# Patient Record
Sex: Male | Born: 1970 | Race: Black or African American | Hispanic: No
Health system: Southern US, Community
[De-identification: ages and names within clinical notes are randomized; demographics above are authoritative.]

## PROBLEM LIST (undated history)

## (undated) HISTORY — PX: NO PAST SURGERIES: SHX2092

---

## 1999-01-19 ENCOUNTER — Emergency Department (HOSPITAL_COMMUNITY): Admission: EM | Admit: 1999-01-19 | Discharge: 1999-01-19 | Payer: Self-pay | Admitting: Emergency Medicine

## 2006-07-21 ENCOUNTER — Emergency Department (HOSPITAL_COMMUNITY): Admission: EM | Admit: 2006-07-21 | Discharge: 2006-07-21 | Payer: Self-pay | Admitting: Emergency Medicine

## 2006-07-26 ENCOUNTER — Emergency Department (HOSPITAL_COMMUNITY): Admission: EM | Admit: 2006-07-26 | Discharge: 2006-07-26 | Payer: Self-pay | Admitting: Emergency Medicine

## 2008-04-05 ENCOUNTER — Emergency Department (HOSPITAL_COMMUNITY): Admission: EM | Admit: 2008-04-05 | Discharge: 2008-04-05 | Payer: Self-pay | Admitting: *Deleted

## 2010-10-13 ENCOUNTER — Inpatient Hospital Stay (INDEPENDENT_AMBULATORY_CARE_PROVIDER_SITE_OTHER)
Admission: RE | Admit: 2010-10-13 | Discharge: 2010-10-13 | Disposition: A | Discharge status: Home / Self Care | Payer: Self-pay | Source: Ambulatory Visit | Attending: Family Medicine | Admitting: Family Medicine

## 2010-10-13 ENCOUNTER — Ambulatory Visit (INDEPENDENT_AMBULATORY_CARE_PROVIDER_SITE_OTHER): Payer: Self-pay

## 2010-10-13 DIAGNOSIS — S82899A Other fracture of unspecified lower leg, initial encounter for closed fracture: Secondary | ICD-10-CM

## 2013-12-17 ENCOUNTER — Emergency Department (HOSPITAL_COMMUNITY): Payer: Self-pay

## 2013-12-17 ENCOUNTER — Emergency Department (HOSPITAL_COMMUNITY)
Admission: EM | Admit: 2013-12-17 | Discharge: 2013-12-17 | Disposition: A | Discharge status: Home / Self Care | Payer: Self-pay | Attending: Emergency Medicine | Admitting: Emergency Medicine

## 2013-12-17 ENCOUNTER — Encounter (HOSPITAL_COMMUNITY): Payer: Self-pay | Admitting: Emergency Medicine

## 2013-12-17 DIAGNOSIS — S0990XA Unspecified injury of head, initial encounter: Secondary | ICD-10-CM | POA: Insufficient documentation

## 2013-12-17 DIAGNOSIS — M549 Dorsalgia, unspecified: Secondary | ICD-10-CM

## 2013-12-17 DIAGNOSIS — M545 Low back pain, unspecified: Secondary | ICD-10-CM

## 2013-12-17 DIAGNOSIS — Y92410 Unspecified street and highway as the place of occurrence of the external cause: Secondary | ICD-10-CM | POA: Insufficient documentation

## 2013-12-17 DIAGNOSIS — M5030 Other cervical disc degeneration, unspecified cervical region: Secondary | ICD-10-CM | POA: Insufficient documentation

## 2013-12-17 DIAGNOSIS — IMO0002 Reserved for concepts with insufficient information to code with codable children: Secondary | ICD-10-CM | POA: Insufficient documentation

## 2013-12-17 DIAGNOSIS — F17200 Nicotine dependence, unspecified, uncomplicated: Secondary | ICD-10-CM | POA: Insufficient documentation

## 2013-12-17 DIAGNOSIS — S0993XA Unspecified injury of face, initial encounter: Secondary | ICD-10-CM | POA: Insufficient documentation

## 2013-12-17 DIAGNOSIS — S199XXA Unspecified injury of neck, initial encounter: Principal | ICD-10-CM

## 2013-12-17 DIAGNOSIS — Y9389 Activity, other specified: Secondary | ICD-10-CM | POA: Insufficient documentation

## 2013-12-17 DIAGNOSIS — S6990XA Unspecified injury of unspecified wrist, hand and finger(s), initial encounter: Secondary | ICD-10-CM | POA: Insufficient documentation

## 2013-12-17 DIAGNOSIS — M542 Cervicalgia: Secondary | ICD-10-CM

## 2013-12-17 MED ORDER — IBUPROFEN 400 MG PO TABS
600.0000 mg | ORAL_TABLET | Freq: Once | ORAL | Status: AC
  Administered 2013-12-17: 600 mg via ORAL
  Filled 2013-12-17 (×2): qty 1

## 2013-12-17 MED ORDER — OXYCODONE-ACETAMINOPHEN 5-325 MG PO TABS
1.0000 | ORAL_TABLET | Freq: Once | ORAL | Status: AC
  Administered 2013-12-17: 1 via ORAL
  Filled 2013-12-17: qty 1

## 2013-12-17 MED ORDER — MELOXICAM 7.5 MG PO TABS: 15.0000 mg | ORAL_TABLET | Freq: Every day | ORAL | Status: DC

## 2013-12-17 MED ORDER — METHOCARBAMOL 500 MG PO TABS: 500.0000 mg | ORAL_TABLET | Freq: Two times a day (BID) | ORAL | Status: DC

## 2013-12-17 MED ORDER — DIAZEPAM 5 MG PO TABS
5.0000 mg | ORAL_TABLET | Freq: Once | ORAL | Status: AC
  Administered 2013-12-17: 5 mg via ORAL
  Filled 2013-12-17: qty 1

## 2013-12-17 MED ORDER — OXYCODONE-ACETAMINOPHEN 5-325 MG PO TABS: 1.0000 | ORAL_TABLET | Freq: Once | ORAL | Status: DC

## 2013-12-17 MED ORDER — HYDROCODONE-ACETAMINOPHEN 5-325 MG PO TABS: 2.0000 | ORAL_TABLET | Freq: Four times a day (QID) | ORAL | Status: DC | PRN

## 2013-12-17 NOTE — ED Notes (Signed)
Declined W/C at D/C and was escorted to lobby by RN. 
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
## 2013-12-21 NOTE — ED Provider Notes (Signed)
Medical screening examination/treatment/procedure(s) were performed by non-physician practitioner and as supervising physician I was immediately available for consultation/collaboration.   EKG Interpretation None       Raeford RazorStephen Azie Mcconahy, MD 12/21/13 [phone]

## 2014-01-03 ENCOUNTER — Encounter: Payer: Self-pay | Admitting: Internal Medicine

## 2014-01-03 ENCOUNTER — Ambulatory Visit: Payer: Self-pay | Attending: Internal Medicine | Admitting: Internal Medicine

## 2014-01-03 VITALS — BP 94/57 | HR 55 | Temp 97.8°F | Resp 18 | Ht 71.0 in | Wt 186.2 lb

## 2014-01-03 DIAGNOSIS — F17200 Nicotine dependence, unspecified, uncomplicated: Secondary | ICD-10-CM | POA: Insufficient documentation

## 2014-01-03 DIAGNOSIS — M545 Low back pain, unspecified: Secondary | ICD-10-CM | POA: Insufficient documentation

## 2014-01-03 DIAGNOSIS — M542 Cervicalgia: Secondary | ICD-10-CM | POA: Insufficient documentation

## 2014-01-03 MED ORDER — MELOXICAM 15 MG PO TABS: 15.0000 mg | ORAL_TABLET | Freq: Every day | ORAL | Status: DC

## 2014-01-03 MED ORDER — METHOCARBAMOL 500 MG PO TABS: 500.0000 mg | ORAL_TABLET | Freq: Two times a day (BID) | ORAL | Status: DC | PRN

## 2014-01-03 MED ORDER — TRAMADOL HCL 50 MG PO TABS: 50.0000 mg | ORAL_TABLET | Freq: Three times a day (TID) | ORAL | Status: DC | PRN

## 2014-01-03 NOTE — Patient Instructions (Signed)
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
## 2014-09-14 ENCOUNTER — Emergency Department (HOSPITAL_COMMUNITY)
Admission: EM | Admit: 2014-09-14 | Discharge: 2014-09-14 | Disposition: A | Discharge status: Home / Self Care | Payer: Self-pay | Attending: Emergency Medicine | Admitting: Emergency Medicine

## 2014-09-14 ENCOUNTER — Encounter (HOSPITAL_COMMUNITY): Payer: Self-pay | Admitting: Nurse Practitioner

## 2014-09-14 ENCOUNTER — Emergency Department (HOSPITAL_COMMUNITY): Payer: Self-pay

## 2014-09-14 DIAGNOSIS — Y92008 Other place in unspecified non-institutional (private) residence as the place of occurrence of the external cause: Secondary | ICD-10-CM | POA: Insufficient documentation

## 2014-09-14 DIAGNOSIS — S161XXA Strain of muscle, fascia and tendon at neck level, initial encounter: Secondary | ICD-10-CM | POA: Insufficient documentation

## 2014-09-14 DIAGNOSIS — Y999 Unspecified external cause status: Secondary | ICD-10-CM | POA: Insufficient documentation

## 2014-09-14 DIAGNOSIS — Z720 Tobacco use: Secondary | ICD-10-CM | POA: Insufficient documentation

## 2014-09-14 DIAGNOSIS — W109XXA Fall (on) (from) unspecified stairs and steps, initial encounter: Secondary | ICD-10-CM | POA: Insufficient documentation

## 2014-09-14 DIAGNOSIS — S4992XA Unspecified injury of left shoulder and upper arm, initial encounter: Secondary | ICD-10-CM | POA: Insufficient documentation

## 2014-09-14 DIAGNOSIS — S39012A Strain of muscle, fascia and tendon of lower back, initial encounter: Secondary | ICD-10-CM | POA: Insufficient documentation

## 2014-09-14 DIAGNOSIS — Y939 Activity, unspecified: Secondary | ICD-10-CM | POA: Insufficient documentation

## 2014-09-14 MED ORDER — HYDROCODONE-ACETAMINOPHEN 5-325 MG PO TABS: 1.0000 | ORAL_TABLET | Freq: Four times a day (QID) | ORAL | Status: DC | PRN

## 2014-09-14 MED ORDER — CYCLOBENZAPRINE HCL 10 MG PO TABS: 10.0000 mg | ORAL_TABLET | Freq: Every day | ORAL | Status: DC

## 2014-09-14 MED ORDER — PREDNISONE 50 MG PO TABS: 50.0000 mg | ORAL_TABLET | Freq: Every day | ORAL | Status: DC

## 2014-09-14 MED ORDER — KETOROLAC TROMETHAMINE 60 MG/2ML IM SOLN
60.0000 mg | Freq: Once | INTRAMUSCULAR | Status: AC
Start: 2014-09-14 — End: 2014-09-14
  Administered 2014-09-14: 60 mg via INTRAMUSCULAR
  Filled 2014-09-14: qty 2

## 2014-09-14 MED ORDER — OXYCODONE-ACETAMINOPHEN 5-325 MG PO TABS
1.0000 | ORAL_TABLET | Freq: Once | ORAL | Status: AC
  Administered 2014-09-14: 1 via ORAL
  Filled 2014-09-14: qty 1

## 2014-09-14 MED ORDER — DEXAMETHASONE SODIUM PHOSPHATE 10 MG/ML IJ SOLN
10.0000 mg | Freq: Once | INTRAMUSCULAR | Status: AC
  Administered 2014-09-14: 10 mg via INTRAMUSCULAR
  Filled 2014-09-14: qty 1

## 2014-09-14 NOTE — Discharge Instructions (Signed)
Return here as needed. Follow up with your doctor. °

## 2014-09-14 NOTE — ED Notes (Signed)
He sipped and fell down about 6 stairs this am. He c/o L arm, L side of neck, buttocks, lower back pain since. He is ambulatory, denies head pain, loc.

## 2014-09-14 NOTE — ED Provider Notes (Signed)
CSN: [id number]     Arrival date & time 09/14/14  1253 History  This chart was scribed for non-physician practitioner Ebbie Ridgehris Shaye Elling, PA, working with Glynn OctaveStephen Rancour, MD, by Tanda RockersMargaux Venter, ED Scribe. This patient was seen in room TR06C/TR06C and the patient's care was started at 1:53 PM.     Chief Complaint  Patient presents with  . Back Pain   The history is provided by the patient. No language interpreter was used.     HPI Comments: Zachary Nixon is a 44 y.o. male who presents to the Emergency Department complaining of sudden onset, lower back pain that occurred this morning. Pt states that he slipped down approximately 6 stairs on his porch today, causing the pain. He claims that the steps were wet which caused him to slip and fall. He reached for the bannister with his left arm and landed on his buttocks. He complains of left shoulder pain and left neck pain as well. Pt denies LOC, urinary or bowel incontinence, saddle anesthesia, or any other symptoms.     History reviewed. No pertinent past medical history. History reviewed. No pertinent past surgical history. Family History  Problem Relation Age of Onset  . Hypertension Mother   . Cancer Father    History  Substance Use Topics  . Smoking status: Current Every Day Smoker -- 0.50 packs/day    Types: Cigarettes  . Smokeless tobacco: Not on file  . Alcohol Use: No    Review of Systems  A complete 10 system review of systems was obtained and all systems are negative except as noted in the HPI and PMH.    Allergies  Review of patient's allergies indicates no known allergies.  Home Medications   Prior to Admission medications   Medication Sig Start Date End Date Taking? Authorizing Provider  HYDROcodone-acetaminophen (NORCO/VICODIN) 5-325 MG per tablet Take 2 tablets by mouth every 6 (six) hours as needed for moderate pain or severe pain. 12/17/13   Junius FinnerErin O'Malley, PA-C  ibuprofen (ADVIL,MOTRIN) 200 MG tablet Take 400 mg  by mouth every 6 (six) hours as needed for moderate pain.    Historical Provider, MD  meloxicam (MOBIC) 15 MG tablet Take 1 tablet (15 mg total) by mouth daily. 01/03/14   Ambrose FinlandValerie A Keck, NP  meloxicam (MOBIC) 7.5 MG tablet Take 2 tablets (15 mg total) by mouth daily. Take daily for 7 days, then daily as needed for pain. 12/17/13   Junius FinnerErin O'Malley, PA-C  methocarbamol (ROBAXIN) 500 MG tablet Take 1 tablet (500 mg total) by mouth 2 (two) times daily as needed for muscle spasms. 01/03/14   Ambrose FinlandValerie A Keck, NP  traMADol (ULTRAM) 50 MG tablet Take 1 tablet (50 mg total) by mouth every 8 (eight) hours as needed. 01/03/14   Ambrose FinlandValerie A Keck, NP   Triage Vitals: BP 129/66 mmHg  Pulse 84  Temp(Src) 98.2 F (36.8 C) (Oral)  Resp 20  SpO2 99%   Physical Exam  Constitutional: He is oriented to person, place, and time. He appears well-developed and well-nourished. No distress.  HENT:  Head: Normocephalic and atraumatic.  Mouth/Throat: Oropharynx is clear and moist.  Eyes: Pupils are equal, round, and reactive to light.  Neck: Normal range of motion. Neck supple. No tracheal deviation present.  Cardiovascular: Normal rate, regular rhythm and normal heart sounds.  Exam reveals no gallop and no friction rub.   No murmur heard. Pulmonary/Chest: Effort normal and breath sounds normal. No respiratory distress.  Musculoskeletal: Normal range of motion.  Diffuse lower back tenderness.  Left neck and left trapezius tenderness.   Neurological: He is alert and oriented to person, place, and time. He has normal reflexes. He exhibits normal muscle tone. Coordination normal.  Skin: Skin is warm and dry.  Psychiatric: He has a normal mood and affect. His behavior is normal.  Nursing note and vitals reviewed.   ED Course  Procedures (including critical care time)  DIAGNOSTIC STUDIES: Oxygen Saturation is 99% on RA, normal by my interpretation.    COORDINATION OF CARE: 1:55 PM-Discussed treatment plan which includes  pain medication, DG C Spine, DG L Spine with pt at bedside and pt agreed to plan.    Imaging Review Dg Cervical Spine Complete  09/14/2014   CLINICAL DATA:  Fall today. cervicalgia/neck pain. LEFT-sided cervical pain radiating into the shoulder.  EXAM: CERVICAL SPINE  4+ VIEWS  COMPARISON:  CT cervical spine 12/17/2013.  FINDINGS: Unchanged cervical spinal alignment. Between 1 mm and 2 mm retrolisthesis of C5 on C6 associated with degenerative disc disease. Collapse of the C5-C6 disc. Posterior disc osteophyte complex is present. Bilateral C5-C6 foraminal stenosis due to uncovertebral spurring. C6-C7 LEFT foraminal stenosis secondary to uncovertebral spurring. There is no cervical spine fracture. Mild levoconvex curve of the cervicothoracic spine. The odontoid is intact. Craniocervical junction is visible and appears normal. Prevertebral soft tissues are normal.  IMPRESSION: No acute abnormality. Moderate cervical spine degenerative disease that is unchanged compared to CT 12/17/2013.   Electronically Signed   By: Andreas NewportGeoffrey  Lamke M.D.   On: 09/14/2014 15:29   Dg Lumbar Spine Complete  09/14/2014   CLINICAL DATA:  Fall.  Pain .  Initial evaluation .  EXAM: LUMBAR SPINE - COMPLETE 4+ VIEW  COMPARISON:  None.  FINDINGS: Diffuse mild multilevel degenerative change. No acute abnormality identified. Minimal retrolisthesis of L5 on S1 of approximately 4 mm noted. This is most likely degenerative.  IMPRESSION: No acute abnormality.  Diffuse mild degenerative change.   Electronically Signed   By: Maisie Fushomas  Register   On: 09/14/2014 15:24    I personally performed the services described in this documentation, which was scribed in my presence. The recorded information has been reviewed and is accurate. '  Charlestine NightChristopher Ladiamond Gallina, PA-C 09/14/14 1600  Glynn OctaveStephen Rancour, MD 09/14/14 [phone]

## 2015-03-14 ENCOUNTER — Emergency Department (HOSPITAL_COMMUNITY)
Admission: EM | Admit: 2015-03-14 | Discharge: 2015-03-14 | Disposition: A | Discharge status: Home / Self Care | Payer: Self-pay | Attending: Emergency Medicine | Admitting: Emergency Medicine

## 2015-03-14 ENCOUNTER — Emergency Department (HOSPITAL_COMMUNITY): Payer: Self-pay

## 2015-03-14 ENCOUNTER — Encounter (HOSPITAL_COMMUNITY): Payer: Self-pay | Admitting: *Deleted

## 2015-03-14 DIAGNOSIS — R51 Headache: Secondary | ICD-10-CM

## 2015-03-14 DIAGNOSIS — Z23 Encounter for immunization: Secondary | ICD-10-CM | POA: Insufficient documentation

## 2015-03-14 DIAGNOSIS — Z7952 Long term (current) use of systemic steroids: Secondary | ICD-10-CM | POA: Insufficient documentation

## 2015-03-14 DIAGNOSIS — Z791 Long term (current) use of non-steroidal anti-inflammatories (NSAID): Secondary | ICD-10-CM | POA: Insufficient documentation

## 2015-03-14 DIAGNOSIS — S0181XA Laceration without foreign body of other part of head, initial encounter: Secondary | ICD-10-CM | POA: Insufficient documentation

## 2015-03-14 DIAGNOSIS — S060X0A Concussion without loss of consciousness, initial encounter: Secondary | ICD-10-CM | POA: Insufficient documentation

## 2015-03-14 DIAGNOSIS — Y9301 Activity, walking, marching and hiking: Secondary | ICD-10-CM | POA: Insufficient documentation

## 2015-03-14 DIAGNOSIS — S0990XA Unspecified injury of head, initial encounter: Secondary | ICD-10-CM

## 2015-03-14 DIAGNOSIS — Y999 Unspecified external cause status: Secondary | ICD-10-CM | POA: Insufficient documentation

## 2015-03-14 DIAGNOSIS — Y9289 Other specified places as the place of occurrence of the external cause: Secondary | ICD-10-CM | POA: Insufficient documentation

## 2015-03-14 DIAGNOSIS — Z79899 Other long term (current) drug therapy: Secondary | ICD-10-CM | POA: Insufficient documentation

## 2015-03-14 DIAGNOSIS — R519 Headache, unspecified: Secondary | ICD-10-CM

## 2015-03-14 DIAGNOSIS — S0191XA Laceration without foreign body of unspecified part of head, initial encounter: Secondary | ICD-10-CM

## 2015-03-14 DIAGNOSIS — Z720 Tobacco use: Secondary | ICD-10-CM | POA: Insufficient documentation

## 2015-03-14 LAB — CBG MONITORING, ED: Glucose-Capillary: 65 mg/dL (ref 65–99)

## 2015-03-14 MED ORDER — PROCHLORPERAZINE EDISYLATE 5 MG/ML IJ SOLN
5.0000 mg | Freq: Four times a day (QID) | INTRAMUSCULAR | Status: DC | PRN
  Administered 2015-03-14: 5 mg via INTRAVENOUS
  Filled 2015-03-14: qty 2

## 2015-03-14 MED ORDER — KETOROLAC TROMETHAMINE 30 MG/ML IJ SOLN
30.0000 mg | Freq: Once | INTRAMUSCULAR | Status: AC
  Administered 2015-03-14: 30 mg via INTRAVENOUS
  Filled 2015-03-14: qty 1

## 2015-03-14 MED ORDER — DIPHENHYDRAMINE HCL 50 MG/ML IJ SOLN
25.0000 mg | Freq: Once | INTRAMUSCULAR | Status: DC
  Filled 2015-03-14: qty 1

## 2015-03-14 MED ORDER — TETANUS-DIPHTH-ACELL PERTUSSIS 5-2.5-18.5 LF-MCG/0.5 IM SUSP
0.5000 mL | Freq: Once | INTRAMUSCULAR | Status: AC
  Administered 2015-03-14: 0.5 mL via INTRAMUSCULAR
  Filled 2015-03-14: qty 0.5

## 2015-03-14 MED ORDER — SODIUM CHLORIDE 0.9 % IV BOLUS (SEPSIS)
500.0000 mL | Freq: Once | INTRAVENOUS | Status: AC
  Administered 2015-03-14: 500 mL via INTRAVENOUS

## 2015-03-14 MED ORDER — DIPHENHYDRAMINE HCL 50 MG/ML IJ SOLN: 25.0000 mg | Freq: Once | INTRAMUSCULAR | Status: DC

## 2015-03-14 MED ORDER — PROCHLORPERAZINE EDISYLATE 5 MG/ML IJ SOLN: 5.0000 mg | Freq: Once | INTRAMUSCULAR | Status: DC

## 2015-03-14 MED ORDER — DEXAMETHASONE SODIUM PHOSPHATE 10 MG/ML IJ SOLN
10.0000 mg | Freq: Once | INTRAMUSCULAR | Status: AC
  Administered 2015-03-14: 10 mg via INTRAVENOUS
  Filled 2015-03-14: qty 1

## 2015-03-14 MED ORDER — BUTALBITAL-APAP-CAFFEINE 50-325-40 MG PO TABS: 1.0000 | ORAL_TABLET | Freq: Four times a day (QID) | ORAL | Status: AC | PRN
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
## 2015-03-14 NOTE — Discharge Instructions (Signed)
Concussion, Adult A concussion, or closed-head injury, is a brain injury caused by a direct blow to the head or by a quick and sudden movement (jolt) of the head or neck. Concussions are usually not life-threatening. Even so, the effects of a concussion can be serious. If you have had a concussion before, you are more likely to experience concussion-like symptoms after a direct blow to the head.  CAUSES  Direct blow to the head, such as from running into another player during a soccer game, being hit in a fight, or hitting your head on a hard surface.  A jolt of the head or neck that causes the brain to move back and forth inside the skull, such as in a car crash. SIGNS AND SYMPTOMS The signs of a concussion can be hard to notice. Early on, they may be missed by you, family members, and health care providers. You may look fine but act or feel differently. Symptoms are usually temporary, but they may last for days, weeks, or even longer. Some symptoms may appear right away while others may not show up for hours or days. Every head injury is different. Symptoms include:  Mild to moderate headaches that will not go away.  A feeling of pressure inside your head.  Having more trouble than usual:  Learning or remembering things you have heard.  Answering questions.  Paying attention or concentrating.  Organizing daily tasks.  Making decisions and solving problems.  Slowness in thinking, acting or reacting, speaking, or reading.  Getting lost or being easily confused.  Feeling tired all the time or lacking energy (fatigued).  Feeling drowsy.  Sleep disturbances.  Sleeping more than usual.  Sleeping less than usual.  Trouble falling asleep.  Trouble sleeping (insomnia).  Loss of balance or feeling lightheaded or dizzy.  Nausea or vomiting.  Numbness or tingling.  Increased sensitivity to:  Sounds.  Lights.  Distractions.  Vision problems or eyes that tire  easily.  Diminished sense of taste or smell.  Ringing in the ears.  Mood changes such as feeling sad or anxious.  Becoming easily irritated or angry for little or no reason.  Lack of motivation.  Seeing or hearing things other people do not see or hear (hallucinations). DIAGNOSIS Your health care provider can usually diagnose a concussion based on a description of your injury and symptoms. He or she will ask whether you passed out (lost consciousness) and whether you are having trouble remembering events that happened right before and during your injury. Your evaluation might include:  A brain scan to look for signs of injury to the brain. Even if the test shows no injury, you may still have a concussion.  Blood tests to be sure other problems are not present. TREATMENT  Concussions are usually treated in an emergency department, in urgent care, or at a clinic. You may need to stay in the hospital overnight for further treatment.  Tell your health care provider if you are taking any medicines, including prescription medicines, over-the-counter medicines, and natural remedies. Some medicines, such as blood thinners (anticoagulants) and aspirin, may increase the chance of complications. Also tell your health care provider whether you have had alcohol or are taking illegal drugs. This information may affect treatment.  Your health care provider will send you home with important instructions to follow.  How fast you will recover from a concussion depends on many factors. These factors include how severe your concussion is, what part of your brain was injured,  your age, and how healthy you were before the concussion.  Most people with mild injuries recover fully. Recovery can take time. In general, recovery is slower in older persons. Also, persons who have had a concussion in the past or have other medical problems may find that it takes longer to recover from their current injury. HOME  CARE INSTRUCTIONS General Instructions  Carefully follow the directions your health care provider gave you.  Only take over-the-counter or prescription medicines for pain, discomfort, or fever as directed by your health care provider.  Take only those medicines that your health care provider has approved.  Do not drink alcohol until your health care provider says you are well enough to do so. Alcohol and certain other drugs may slow your recovery and can put you at risk of further injury.  If it is harder than usual to remember things, write them down.  If you are easily distracted, try to do one thing at a time. For example, do not try to watch TV while fixing dinner.  Talk with family members or close friends when making important decisions.  Keep all follow-up appointments. Repeated evaluation of your symptoms is recommended for your recovery.  Watch your symptoms and tell others to do the same. Complications sometimes occur after a concussion. Older adults with a brain injury may have a higher risk of serious complications, such as a blood clot on the brain.  Tell your teachers, school nurse, school counselor, coach, athletic trainer, or work Freight forwarder about your injury, symptoms, and restrictions. Tell them about what you can or cannot do. They should watch for:  Increased problems with attention or concentration.  Increased difficulty remembering or learning new information.  Increased time needed to complete tasks or assignments.  Increased irritability or decreased ability to cope with stress.  Increased symptoms.  Rest. Rest helps the brain to heal. Make sure you:  Get plenty of sleep at night. Avoid staying up late at night.  Keep the same bedtime hours on weekends and weekdays.  Rest during the day. Take daytime naps or rest breaks when you feel tired.  Limit activities that require a lot of thought or concentration. These include:  Doing homework or job-related  work.  Watching TV.  Working on the computer.  Avoid any situation where there is potential for another head injury (football, hockey, soccer, basketball, martial arts, downhill snow sports and horseback riding). Your condition will get worse every time you experience a concussion. You should avoid these activities until you are evaluated by the appropriate follow-up health care providers. Returning To Your Regular Activities You will need to return to your normal activities slowly, not all at once. You must give your body and brain enough time for recovery.  Do not return to sports or other athletic activities until your health care provider tells you it is safe to do so.  Ask your health care provider when you can drive, ride a bicycle, or operate heavy machinery. Your ability to react may be slower after a brain injury. Never do these activities if you are dizzy.  Ask your health care provider about when you can return to work or school. Preventing Another Concussion It is very important to avoid another brain injury, especially before you have recovered. In rare cases, another injury can lead to permanent brain damage, brain swelling, or death. The risk of this is greatest during the first 7-10 days after a head injury. Avoid injuries by:  Wearing a  seat belt when riding in a car.  Drinking alcohol only in moderation.  Wearing a helmet when biking, skiing, skateboarding, skating, or doing similar activities.  Avoiding activities that could lead to a second concussion, such as contact or recreational sports, until your health care provider says it is okay.  Taking safety measures in your home.  Remove clutter and tripping hazards from floors and stairways.  Use grab bars in bathrooms and handrails by stairs.  Place non-slip mats on floors and in bathtubs.  Improve lighting in dim areas. SEEK MEDICAL CARE IF:  You have increased problems paying attention or  concentrating.  You have increased difficulty remembering or learning new information.  You need more time to complete tasks or assignments than before.  You have increased irritability or decreased ability to cope with stress.  You have more symptoms than before. Seek medical care if you have any of the following symptoms for more than 2 weeks after your injury:  Lasting (chronic) headaches.  Dizziness or balance problems.  Nausea.  Vision problems.  Increased sensitivity to noise or light.  Depression or mood swings.  Anxiety or irritability.  Memory problems.  Difficulty concentrating or paying attention.  Sleep problems.  Feeling tired all the time. SEEK IMMEDIATE MEDICAL CARE IF:  You have severe or worsening headaches. These may be a sign of a blood clot in the brain.  You have weakness (even if only in one hand, leg, or part of the face).  You have numbness.  You have decreased coordination.  You vomit repeatedly.  You have increased sleepiness.  One pupil is larger than the other.  You have convulsions.  You have slurred speech.  You have increased confusion. This may be a sign of a blood clot in the brain.  You have increased restlessness, agitation, or irritability.  You are unable to recognize people or places.  You have neck pain.  It is difficult to wake you up.  You have unusual behavior changes.  You lose consciousness. MAKE SURE YOU:  Understand these instructions.  Will watch your condition.  Will get help right away if you are not doing well or get worse.   This information is not intended to replace advice given to you by your health care provider. Make sure you discuss any questions you have with your health care provider.   Document Released: 07/18/2003 Document Revised: 05/18/2014 Document Reviewed: 11/17/2012 Elsevier Interactive Patient Education 2016 [employer]  General Headache Without Cause A headache is  pain or discomfort felt around the head or neck area. The specific cause of a headache may not be found. There are many causes and types of headaches. A few common ones are:  Tension headaches.  Migraine headaches.  Cluster headaches.  Chronic daily headaches. HOME CARE INSTRUCTIONS  Watch your condition for any changes. Take these steps to help with your condition: Managing Pain  Take over-the-counter and prescription medicines only as told by your health care provider.  Lie down in a dark, quiet room when you have a headache.  If directed, apply ice to the head and neck area:  Put ice in a plastic bag.  Place a towel between your skin and the bag.  Leave the ice on for 20 minutes, 2-3 times per day.  Use a heating pad or hot shower to apply heat to the head and neck area as told by your health care provider.  Keep lights dim if bright lights bother you or make  your headaches worse. Eating and Drinking  Eat meals on a regular schedule.  Limit alcohol use.  Decrease the amount of caffeine you drink, or stop drinking caffeine. General Instructions  Keep all follow-up visits as told by your health care provider. This is important.  Keep a headache journal to help find out what may trigger your headaches. For example, write down:  What you eat and drink.  How much sleep you get.  Any change to your diet or medicines.  Try massage or other relaxation techniques.  Limit stress.  Sit up straight, and do not tense your muscles.  Do not use tobacco products, including cigarettes, chewing tobacco, or e-cigarettes. If you need help quitting, ask your health care provider.  Exercise regularly as told by your health care provider.  Sleep on a regular schedule. Get 7-9 hours of sleep, or the amount recommended by your health care provider. SEEK MEDICAL CARE IF:   Your symptoms are not helped by medicine.  You have a headache that is different from the usual  headache.  You have nausea or you vomit.  You have a fever. SEEK IMMEDIATE MEDICAL CARE IF:   Your headache becomes severe.  You have repeated vomiting.  You have a stiff neck.  You have a loss of vision.  You have problems with speech.  You have pain in the eye or ear.  You have muscular weakness or loss of muscle control.  You lose your balance or have trouble walking.  You feel faint or pass out.  You have confusion.   This information is not intended to replace advice given to you by your health care provider. Make sure you discuss any questions you have with your health care provider.   Document Released: 04/27/2005 Document Revised: 01/16/2015 Document Reviewed: 08/20/2014 Elsevier Interactive Patient Education 2016 [employer]  Post-Concussion Syndrome Post-concussion syndrome describes the symptoms that can occur after a head injury. These symptoms can last from weeks to months. CAUSES  It is not clear why some head injuries cause post-concussion syndrome. It can occur whether your head injury was mild or severe and whether you were wearing head protection or not.  SIGNS AND SYMPTOMS  Memory difficulties.  Dizziness.  Headaches.  Double vision or blurry vision.  Sensitivity to light.  Hearing difficulties.  Depression.  Tiredness.  Weakness.  Difficulty with concentration.  Difficulty sleeping or staying asleep.  Vomiting.  Poor balance or instability on your feet.  Slow reaction time.  Difficulty learning and remembering things you have heard. DIAGNOSIS  There is no test to determine whether you have post-concussion syndrome. Your health care provider may order an imaging scan of your brain, such as a CT scan, to check for other problems that may be causing your symptoms (such as a severe injury inside your skull). TREATMENT  Usually, these problems disappear over time without medical care. Your health care provider may prescribe  medicine to help ease your symptoms. It is important to follow up with a neurologist to evaluate your recovery and address any lingering symptoms or issues. HOME CARE INSTRUCTIONS   Take medicines only as directed by your health care provider. Do not take aspirin. Aspirin can slow blood clotting.  Sleep with your head slightly elevated to help with headaches.  Avoid any situation where there is potential for another head injury. This includes football, hockey, soccer, basketball, martial arts, downhill snow sports, and horseback riding. Your condition will get worse every time you experience a  concussion. You should avoid these activities until you are evaluated by the appropriate follow-up health care providers.  Keep all follow-up visits as directed by your health care provider. This is important. SEEK MEDICAL CARE IF:  You have increased problems paying attention or concentrating.  You have increased difficulty remembering or learning new information.  You need more time to complete tasks or assignments than before.  You have increased irritability or decreased ability to cope with stress.  You have more symptoms than before. Seek medical care if you have any of the following symptoms for more than two weeks after your injury:  Lasting (chronic) headaches.  Dizziness or balance problems.  Nausea.  Vision problems.  Increased sensitivity to noise or light.  Depression or mood swings.  Anxiety or irritability.  Memory problems.  Difficulty concentrating or paying attention.  Sleep problems.  Feeling tired all the time. SEEK IMMEDIATE MEDICAL CARE IF:  You have confusion or unusual drowsiness.  Others find it difficult to wake you up.  You have nausea or persistent, forceful vomiting.  You feel like you are moving when you are not (vertigo). Your eyes may move rapidly back and forth.  You have convulsions or faint.  You have severe, persistent headaches that  are not relieved by medicine.  You cannot use your arms or legs normally.  One of your pupils is larger than the other.  You have clear or bloody discharge from your nose or ears.  Your problems are getting worse, not better. MAKE SURE YOU:  Understand these instructions.  Will watch your condition.  Will get help right away if you are not doing well or get worse.   This information is not intended to replace advice given to you by your health care provider. Make sure you discuss any questions you have with your health care provider.   Document Released: 10/17/2001 Document Revised: 05/18/2014 Document Reviewed: 08/02/2013 Elsevier Interactive Patient Education 2016 [employer]  Wound Care Taking care of your wound properly can help to prevent pain and infection. It can also help your wound to heal more quickly.  HOW TO CARE FOR YOUR WOUND  Take or apply over-the-counter and prescription medicines only as told by your health care provider.  If you were prescribed antibiotic medicine, take or apply it as told by your health care provider. Do not stop using the antibiotic even if your condition improves.  Clean the wound each day or as told by your health care provider.  Wash the wound with mild soap and water.  Rinse the wound with water to remove all soap.  Pat the wound dry with a clean towel. Do not rub it.  There are many different ways to close and cover a wound. For example, a wound can be covered with stitches (sutures), skin glue, or adhesive strips. Follow instructions from your health care provider about:  How to take care of your wound.  When and how you should change your bandage (dressing).  When you should remove your dressing.  Removing whatever was used to close your wound.  Check your wound every day for signs of infection. Watch for:  Redness, swelling, or pain.  Fluid, blood, or pus.  Keep the dressing dry until your health care provider  says it can be removed. Do not take baths, swim, use a hot tub, or do anything that would put your wound underwater until your health care provider approves.  Raise (elevate) the injured area above the level  of your heart while you are sitting or lying down.  Do not scratch or pick at the wound.  Keep all follow-up visits as told by your health care provider. This is important. SEEK MEDICAL CARE IF:  You received a tetanus shot and you have swelling, severe pain, redness, or bleeding at the injection site.  You have a fever.  Your pain is not controlled with medicine.  You have increased redness, swelling, or pain at the site of your wound.  You have fluid, blood, or pus coming from your wound.  You notice a bad smell coming from your wound or your dressing. SEEK IMMEDIATE MEDICAL CARE IF:  You have a red streak going away from your wound.   This information is not intended to replace advice given to you by your health care provider. Make sure you discuss any questions you have with your health care provider.   Document Released: 02/04/2008 Document Revised: 09/11/2014 Document Reviewed: 04/23/2014 Elsevier Interactive Patient Education 2016 [employer]  Head Injury, Adult You have received a head injury. It does not appear serious at this time. Headaches and vomiting are common following head injury. It should be easy to awaken from sleeping. Sometimes it is necessary for you to stay in the emergency department for a while for observation. Sometimes admission to the hospital may be needed. After injuries such as yours, most problems occur within the first 24 hours, but side effects may occur up to 7-10 days after the injury. It is important for you to carefully monitor your condition and contact your health care provider or seek immediate medical care if there is a change in your condition. WHAT ARE THE TYPES OF HEAD INJURIES? Head injuries can be as minor as a bump. Some head  injuries can be more severe. More severe head injuries include:  A jarring injury to the brain (concussion).  A bruise of the brain (contusion). This mean there is bleeding in the brain that can cause swelling.  A cracked skull (skull fracture).  Bleeding in the brain that collects, clots, and forms a bump (hematoma). WHAT CAUSES A HEAD INJURY? A serious head injury is most likely to happen to someone who is in a car wreck and is not wearing a seat belt. Other causes of major head injuries include bicycle or motorcycle accidents, sports injuries, and falls. HOW ARE HEAD INJURIES DIAGNOSED? A complete history of the event leading to the injury and your current symptoms will be helpful in diagnosing head injuries. Many times, pictures of the brain, such as CT or MRI are needed to see the extent of the injury. Often, an overnight hospital stay is necessary for observation.  WHEN SHOULD I SEEK IMMEDIATE MEDICAL CARE?  You should get help right away if:  You have confusion or drowsiness.  You feel sick to your stomach (nauseous) or have continued, forceful vomiting.  You have dizziness or unsteadiness that is getting worse.  You have severe, continued headaches not relieved by medicine. Only take over-the-counter or prescription medicines for pain, fever, or discomfort as directed by your health care provider.  You do not have normal function of the arms or legs or are unable to walk.  You notice changes in the black spots in the center of the colored part of your eye (pupil).  You have a clear or bloody fluid coming from your nose or ears.  You have a loss of vision. During the next 24 hours after the injury, you must  stay with someone who can watch you for the warning signs. This person should contact local emergency services (911 in the [place] if you have seizures, you become unconscious, or you are unable to wake up. HOW CAN I PREVENT A HEAD INJURY IN THE FUTURE? The most important  factor for preventing major head injuries is avoiding motor vehicle accidents. To minimize the potential for damage to your head, it is crucial to wear seat belts while riding in motor vehicles. Wearing helmets while bike riding and playing collision sports (like football) is also helpful. Also, avoiding dangerous activities around the house will further help reduce your risk of head injury.  WHEN CAN I RETURN TO NORMAL ACTIVITIES AND ATHLETICS? You should be reevaluated by your health care provider before returning to these activities. If you have any of the following symptoms, you should not return to activities or contact sports until 1 week after the symptoms have stopped:  Persistent headache.  Dizziness or vertigo.  Poor attention and concentration.  Confusion.  Memory problems.  Nausea or vomiting.  Fatigue or tire easily.  Irritability.  Intolerant of bright lights or loud noises.  Anxiety or depression.  Disturbed sleep. MAKE SURE YOU:   Understand these instructions.  Will watch your condition.  Will get help right away if you are not doing well or get worse.   This information is not intended to replace advice given to you by your health care provider. Make sure you discuss any questions you have with your health care provider.   Document Released: 04/27/2005 Document Revised: 05/18/2014 Document Reviewed: 01/02/2013 Elsevier Interactive Patient Education [employer]

## 2015-03-14 NOTE — ED Notes (Signed)
Patient called x3 for triage
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
## 2016-06-05 ENCOUNTER — Encounter (HOSPITAL_COMMUNITY): Payer: Self-pay | Admitting: Nurse Practitioner

## 2016-06-05 ENCOUNTER — Emergency Department (HOSPITAL_COMMUNITY)
Admission: EM | Admit: 2016-06-05 | Discharge: 2016-06-05 | Disposition: A | Discharge status: Home / Self Care | Payer: Self-pay | Attending: Emergency Medicine | Admitting: Emergency Medicine

## 2016-06-05 DIAGNOSIS — F17210 Nicotine dependence, cigarettes, uncomplicated: Secondary | ICD-10-CM | POA: Insufficient documentation

## 2016-06-05 DIAGNOSIS — K0889 Other specified disorders of teeth and supporting structures: Secondary | ICD-10-CM | POA: Insufficient documentation

## 2016-06-05 MED ORDER — OXYCODONE-ACETAMINOPHEN 5-325 MG PO TABS
2.0000 | ORAL_TABLET | Freq: Four times a day (QID) | ORAL | 0 refills | Status: AC | PRN
Start: 2016-06-05 — End: 2016-06-08

## 2016-06-05 MED ORDER — PENICILLIN V POTASSIUM 500 MG PO TABS: 500.0000 mg | ORAL_TABLET | Freq: Three times a day (TID) | ORAL | 0 refills | Status: AC

## 2016-06-05 MED ORDER — HYDROCODONE-ACETAMINOPHEN 5-325 MG PO TABS
2.0000 | ORAL_TABLET | Freq: Once | ORAL | Status: AC
  Administered 2016-06-05: 2 via ORAL
  Filled 2016-06-05: qty 2

## 2016-06-05 NOTE — ED Provider Notes (Signed)
[facility]-EMERGENCY DEPT Provider Note   CSN: [id number] Arrival date & time: 06/05/16  1147 By signing my name below, I, Bridgette Habermann, attest that this documentation has been prepared under the direction and in the presence of Sharen Heck, PA-C. Electronically Signed: Bridgette Habermann, ED Scribe. 06/05/16. 2:59 PM.  History   Chief Complaint Chief Complaint  Patient presents with  . Dental Pain    HPI The history is provided by the patient. No language interpreter was used.   HPI Comments: Zachary Nixon is a 46 y.o. male with no pertinent PMHx who presents to the Emergency Department complaining of R upper dental pain onset several months ago, worsening yesterday. Pt states he was not able to sleep last night due to his pain. Pain is exacerbated with chewing. He has taken 800mg  Ibuprofen with no relief to his pain. Pt does not have a dentist he regularly follows up with. Pt states he has had this dental pain for a long time, used to get antibiotics and ibuprofen while he was incarcerated.  Pt is a smoker. He denies fever, chills, or any other associated symptoms.  History reviewed. No pertinent past medical history.  There are no active problems to display for this patient.   History reviewed. No pertinent surgical history.   Home Medications    Prior to Admission medications   Medication Sig Start Date End Date Taking? Authorizing Provider  cyclobenzaprine (FLEXERIL) 10 MG tablet Take 1 tablet (10 mg total) by mouth at bedtime. 09/14/14   Charlestine Night, PA-C  HYDROcodone-acetaminophen (NORCO/VICODIN) 5-325 MG per tablet Take 1 tablet by mouth every 6 (six) hours as needed for moderate pain. 09/14/14   Charlestine Night, PA-C  ibuprofen (ADVIL,MOTRIN) 200 MG tablet Take 400 mg by mouth every 6 (six) hours as needed for moderate pain.    Historical Provider, MD  meloxicam (MOBIC) 15 MG tablet Take 1 tablet (15 mg total) by mouth daily. 01/03/14   Ambrose Finland, NP  meloxicam (MOBIC)  7.5 MG tablet Take 2 tablets (15 mg total) by mouth daily. Take daily for 7 days, then daily as needed for pain. 12/17/13   Junius Finner, PA-C  methocarbamol (ROBAXIN) 500 MG tablet Take 1 tablet (500 mg total) by mouth 2 (two) times daily as needed for muscle spasms. 01/03/14   Ambrose Finland, NP  oxyCODONE-acetaminophen (PERCOCET/ROXICET) 5-325 MG tablet Take 2 tablets by mouth every 6 (six) hours as needed for severe pain. 06/05/16 06/08/16  Liberty Handy, PA-C  penicillin v potassium (VEETID) 500 MG tablet Take 1 tablet (500 mg total) by mouth 3 (three) times daily. 06/05/16 06/12/16  Liberty Handy, PA-C  predniSONE (DELTASONE) 50 MG tablet Take 1 tablet (50 mg total) by mouth daily. 09/14/14   Charlestine Night, PA-C  traMADol (ULTRAM) 50 MG tablet Take 1 tablet (50 mg total) by mouth every 8 (eight) hours as needed. 01/03/14   Ambrose Finland, NP    Family History Family History  Problem Relation Age of Onset  . Hypertension Mother   . Cancer Father     Social History Social History  Substance Use Topics  . Smoking status: Current Every Day Smoker    Packs/day: 0.50    Types: Cigarettes  . Smokeless tobacco: Never Used  . Alcohol use No     Allergies   Patient has no known allergies.   Review of Systems Review of Systems  Constitutional: Negative for chills and fever.  HENT: Positive for dental problem.  Negative for congestion and sore throat.   Eyes: Negative for visual disturbance.  Respiratory: Negative for cough, chest tightness and shortness of breath.   Cardiovascular: Negative for chest pain.  Gastrointestinal: Negative for abdominal pain, constipation, diarrhea, nausea and vomiting.  Genitourinary: Negative for decreased urine volume and difficulty urinating.  Musculoskeletal: Negative for arthralgias and joint swelling.  Skin: Negative for rash.  Neurological: Negative for dizziness, light-headedness and headaches.   Physical Exam Updated Vital Signs BP  121/61   Pulse 61   Temp 98.2 F (36.8 C) (Oral)   Resp 18   SpO2 98%   Physical Exam  Constitutional: He is oriented to person, place, and time. He appears well-developed and well-nourished. No distress.  NAD.  HENT:  Head: Normocephalic and atraumatic.  Right Ear: External ear normal.  Left Ear: External ear normal.  Nose: Nose normal.  Mouth/Throat: Oropharynx is clear and moist. No dental abscesses. No oropharyngeal exudate.  Moist mucous membranes.  No nasal mucosa edema. Oropharynx and tonsils pink without erythema, edema, exudates or lesions.  Uvula midline. No trismus.  Tenderness along the right top gumline without no fluctuance, edema or signs of abscess.  Eyes: Conjunctivae and EOM are normal. Pupils are equal, round, and reactive to light. No scleral icterus.  Neck: Normal range of motion. Neck supple. No JVD present. No tracheal deviation present.  Cardiovascular: Normal rate, regular rhythm, normal heart sounds and intact distal pulses.   No murmur heard. Pulmonary/Chest: Effort normal and breath sounds normal. He has no wheezes.  Abdominal: Soft. He exhibits no distension. There is no tenderness.  Musculoskeletal: Normal range of motion. He exhibits no deformity.  Lymphadenopathy:    He has no cervical adenopathy.  Neurological: He is alert and oriented to person, place, and time.  Skin: Skin is warm and dry. Capillary refill takes less than 2 seconds.  Psychiatric: He has a normal mood and affect. His behavior is normal. Judgment and thought content normal.  Nursing note and vitals reviewed.    ED Treatments / Results  DIAGNOSTIC STUDIES: Oxygen Saturation is 98% on RA, normal by my interpretation.  COORDINATION OF CARE: 2:59 PM Discussed treatment plan with pt at bedside which includes Rx antibiotics and Percocet and pt agreed to plan.  Labs (all labs ordered are listed, but only abnormal results are displayed) Labs Reviewed - No data to display  EKG   EKG Interpretation None       Radiology No results found.  Procedures Procedures (including critical care time)  Medications Ordered in ED Medications  HYDROcodone-acetaminophen (NORCO/VICODIN) 5-325 MG per tablet 2 tablet (2 tablets Oral Given 06/05/16 1523)     Initial Impression / Assessment and Plan / ED Course  I have reviewed the triage vital signs and the nursing notes.  Pertinent labs & imaging results that were available during my care of the patient were reviewed by me and considered in my medical decision making (see chart for details).    Final Clinical Impressions(s) / ED Diagnoses   Final diagnoses:  Pain, dental   Patient with dentalgia. No abscess requiring immediate incision and drainage. Exam not concerning for Ludwig's angina or pharyngeal abscess.  Will treat with antibiotics and short course of percocet.  I reviewed [place] narcotic data base and patient has not been prescribed any narcotics recently.  Pt recently got released from prison and has not been able to establish health care or dental care.  I advised patient we will not be able to  refill percocet pain medication in the ED and instructed him to follow-up with dentist.  Pt verbalized understanding and is agreeable to discharge plan.  Discussed return precautions. Pt safe for discharge.  New Prescriptions Discharge Medication List as of 06/05/2016  3:23 PM    START taking these medications   Details  oxyCODONE-acetaminophen (PERCOCET/ROXICET) 5-325 MG tablet Take 2 tablets by mouth every 6 (six) hours as needed for severe pain., Starting Fri 06/05/2016, Until Mon 06/08/2016, Print    penicillin v potassium (VEETID) 500 MG tablet Take 1 tablet (500 mg total) by mouth 3 (three) times daily., Starting Fri 06/05/2016, Until Fri 06/12/2016, Print       I personally performed the services described in this documentation, which was scribed in my presence. The recorded information has been reviewed and is  accurate.   Liberty Handylaudia J Arbadella Kimbler, PA-C 06/05/16 1656    Melene Planan Floyd, DO 06/06/16 [phone]

## 2016-06-05 NOTE — Discharge Instructions (Signed)
You have been prescribed antibiotics and a short course of pain medication.  Please reserve pain medication for severe tooth pain. You may take up to 600-800 mg of ibuprofen three times a day for pain.  It is important that you follow up with a dentist (Dr. Mayford Knifeurner) for further evaluation and possible extractions.

## 2016-06-05 NOTE — ED Triage Notes (Addendum)
Pt presents with c/o dental pain. The pain began several months ago. The pain increased severely yesterday and he was not able to sleep last night due to the pain. He took 800mg  ibuprofen for the pain with no relief. He does not have a dentist

## 2016-06-09 ENCOUNTER — Encounter: Payer: Self-pay | Admitting: Pediatric Intensive Care

## 2016-06-09 MED FILL — PENICILLIN VK 500 MG TABLET: 500 | 7 days supply | Qty: 21 | Fill #0

## 2016-06-11 NOTE — Congregational Nurse Program (Signed)
Congregational Nurse Program Note  Date of Encounter: 06/10/2016  Past Medical History: No past medical history on file.  Encounter Details:     CNP Questionnaire - 06/10/16 1055      Patient Demographics   Is this a new or existing patient? New   Patient is considered a/an Not Applicable   Race American Indian/[place] Native     Patient Assistance   Location of Patient Assistance Not Applicable   Patient's financial/insurance status Low Income;Self-Pay (Uninsured)   Uninsured Patient ([employer], trade unionCard/Care Connects) Yes   Interventions Not Applicable   Patient referred to apply for the following financial assistance Not Applicable   Food insecurities addressed Not Applicable   Transportation assistance No   Assistance securing medications No   Educational health offerings Acute disease;Medications     Encounter Details   Primary purpose of visit Acute Illness/Condition Visit;Other   Was an Emergency Department visit averted? Not Applicable   Does patient have a medical provider? Yes   Patient referred to Not Applicable   Was a mental health screening completed? (GAINS tool) No   Does patient have dental issues? Yes   Was a dental referral made? No resources for a referral   Does patient have vision issues? No   Does your patient have an abnormal blood pressure today? No   Since previous encounter, have you referred patient for abnormal blood pressure that resulted in a new diagnosis or medication change? No   Does your patient have an abnormal blood glucose today? No   Since previous encounter, have you referred patient for abnormal blood glucose that resulted in a new diagnosis or medication change? No   Was there a life-saving intervention made? No     Client was provided with anti-biotic prescribed for his dental issues yesterday.  He was also prescribed Percocet, which our program cannot fill.  Client stated his sister had agreed to pay for this prescription.  Assisted  client in locating prescription so it can be filled

## 2016-06-27 NOTE — Congregational Nurse Program (Signed)
Congregational Nurse Program Note  Date of Encounter: 06/09/2016  Past Medical History: No past medical history on file.  Encounter Details:     CNP Questionnaire - 06/10/16 1055      Patient Demographics   Is this a new or existing patient? New   Patient is considered a/an Not Applicable   Race American Indian/[place] Native     Patient Assistance   Location of Patient Assistance Not Applicable   Patient's financial/insurance status Low Income;Self-Pay (Uninsured)   Uninsured Patient ([employer] officer, trade unionCard/Care Connects) Yes   Interventions Not Applicable   Patient referred to apply for the following financial assistance Not Applicable   Food insecurities addressed Not Applicable   Transportation assistance No   Assistance securing medications No   Educational health offerings Acute disease;Medications     Encounter Details   Primary purpose of visit Acute Illness/Condition Visit;Other   Was an Emergency Department visit averted? Not Applicable   Does patient have a medical provider? Yes   Patient referred to Not Applicable   Was a mental health screening completed? (GAINS tool) No   Does patient have dental issues? Yes   Was a dental referral made? No resources for a referral   Does patient have vision issues? No   Does your patient have an abnormal blood pressure today? No   Since previous encounter, have you referred patient for abnormal blood pressure that resulted in a new diagnosis or medication change? No   Does your patient have an abnormal blood glucose today? No   Since previous encounter, have you referred patient for abnormal blood glucose that resulted in a new diagnosis or medication change? No   Was there a life-saving intervention made? No     Client was seen in ED for dental infection. Given prescription for pain medication and antibiotic. CN told client that she could obtain antibiotic but not pain medication. CN will return paper prescition to client so he can  have it filled at another source.

## 2016-08-22 ENCOUNTER — Emergency Department (HOSPITAL_COMMUNITY)
Admission: EM | Admit: 2016-08-22 | Discharge: 2016-08-23 | Disposition: A | Discharge status: Home / Self Care | Payer: Self-pay | Attending: Emergency Medicine | Admitting: Emergency Medicine

## 2016-08-22 ENCOUNTER — Encounter (HOSPITAL_COMMUNITY): Payer: Self-pay | Admitting: Emergency Medicine

## 2016-08-22 DIAGNOSIS — M25511 Pain in right shoulder: Secondary | ICD-10-CM | POA: Insufficient documentation

## 2016-08-22 DIAGNOSIS — K029 Dental caries, unspecified: Secondary | ICD-10-CM | POA: Insufficient documentation

## 2016-08-22 DIAGNOSIS — F17210 Nicotine dependence, cigarettes, uncomplicated: Secondary | ICD-10-CM | POA: Insufficient documentation

## 2016-08-22 MED ORDER — IBUPROFEN 200 MG PO TABS
600.0000 mg | ORAL_TABLET | Freq: Once | ORAL | Status: AC
  Administered 2016-08-22: 600 mg via ORAL
  Filled 2016-08-22: qty 1

## 2016-08-22 MED ORDER — IBUPROFEN 600 MG PO TABS: 600.0000 mg | ORAL_TABLET | Freq: Four times a day (QID) | ORAL | 0 refills | Status: DC | PRN

## 2016-08-22 NOTE — ED Provider Notes (Signed)
[facility]-EMERGENCY DEPT Provider Note   CSN: [id number] Arrival date & time: 08/22/16  2241     History   Chief Complaint Chief Complaint  Patient presents with  . Dental Pain    HPI Zachary Nixon is a 46 y.o. male.  This 46 year old gentleman with history of dental cavity in his left lower second molar he was seen 3 months ago for the same for follow-up with a dentist he also reports that he is having some right shoulder pain and right-sided neck pain from carrying his heavy backpack this is been bothering him for the last 2 days      History reviewed. No pertinent past medical history.  There are no active problems to display for this patient.   History reviewed. No pertinent surgical history.     Home Medications    Prior to Admission medications   Medication Sig Start Date End Date Taking? Authorizing Provider  cyclobenzaprine (FLEXERIL) 10 MG tablet Take 1 tablet (10 mg total) by mouth at bedtime. 09/14/14   Charlestine Night, PA-C  HYDROcodone-acetaminophen (NORCO/VICODIN) 5-325 MG per tablet Take 1 tablet by mouth every 6 (six) hours as needed for moderate pain. 09/14/14   Charlestine Night, PA-C  ibuprofen (ADVIL,MOTRIN) 200 MG tablet Take 400 mg by mouth every 6 (six) hours as needed for moderate pain.    Historical Provider, MD  ibuprofen (ADVIL,MOTRIN) 600 MG tablet Take 1 tablet (600 mg total) by mouth every 6 (six) hours as needed. 08/22/16   Earley Favor, NP  meloxicam (MOBIC) 15 MG tablet Take 1 tablet (15 mg total) by mouth daily. 01/03/14   Ambrose Finland, NP  meloxicam (MOBIC) 7.5 MG tablet Take 2 tablets (15 mg total) by mouth daily. Take daily for 7 days, then daily as needed for pain. 12/17/13   Junius Finner, PA-C  methocarbamol (ROBAXIN) 500 MG tablet Take 1 tablet (500 mg total) by mouth 2 (two) times daily as needed for muscle spasms. 01/03/14   Ambrose Finland, NP  predniSONE (DELTASONE) 50 MG tablet Take 1 tablet (50 mg total) by mouth daily. 09/14/14    Charlestine Night, PA-C  traMADol (ULTRAM) 50 MG tablet Take 1 tablet (50 mg total) by mouth every 8 (eight) hours as needed. 01/03/14   Ambrose Finland, NP    Family History Family History  Problem Relation Age of Onset  . Hypertension Mother   . Cancer Father     Social History Social History  Substance Use Topics  . Smoking status: Current Every Day Smoker    Packs/day: 0.50    Types: Cigarettes  . Smokeless tobacco: Never Used  . Alcohol use No     Allergies   Patient has no known allergies.   Review of Systems Review of Systems  Constitutional: Negative for fever.  HENT: Positive for dental problem.   Musculoskeletal: Positive for myalgias and neck pain.  All other systems reviewed and are negative.    Physical Exam Updated Vital Signs BP (!) 122/95 (BP Location: Left Arm)   Pulse 65   Temp 97.8 F (36.6 C) (Oral)   Resp 16   Ht 6' (1.829 m)   Wt 87.8 kg   SpO2 98%   BMI 26.25 kg/m   Physical Exam  Constitutional: He appears well-nourished.  HENT:  Head: Normocephalic.  Mouth/Throat:    Eyes: Pupils are equal, round, and reactive to light.  Neck: Normal range of motion. No spinous process tenderness and no muscular tenderness present. No  neck rigidity. Normal range of motion present.  Cardiovascular: Normal rate.   Pulmonary/Chest: Effort normal.  Musculoskeletal: Normal range of motion.  Lymphadenopathy:    He has no cervical adenopathy.  Neurological: He is alert.  Skin: Skin is warm.  Nursing note and vitals reviewed.    ED Treatments / Results  Labs (all labs ordered are listed, but only abnormal results are displayed) Labs Reviewed - No data to display  EKG  EKG Interpretation None       Radiology No results found.  Procedures Procedures (including critical care time)  Medications Ordered in ED Medications  ibuprofen (ADVIL,MOTRIN) tablet 600 mg (not administered)     Initial Impression / Assessment and Plan / ED  Course  I have reviewed the triage vital signs and the nursing notes.  Pertinent labs & imaging results that were available during my care of the patient were reviewed by me and considered in my medical decision making (see chart for details).    She has been given the dental resource guide a prescription for ibuprofen estrogen she is due to asked to fill the cavity under separate basis. Has full range of motion of neck shoulder and arm good distal pulses no swelling or deformity noted Has been instructed to wear his back that with a strep over both shoulders rather than just over one arm/shoulder    Final Clinical Impressions(s) / ED Diagnoses   Final diagnoses:  Dental caries  Right shoulder pain, unspecified chronicity    New Prescriptions New Prescriptions   IBUPROFEN (ADVIL,MOTRIN) 600 MG TABLET    Take 1 tablet (600 mg total) by mouth every 6 (six) hours as needed.     Earley Favor, NP 08/22/16 2346    Zadie Rhine, MD 08/25/16 [phone]

## 2016-08-22 NOTE — Discharge Instructions (Signed)
As discussed using get some dental wax from the pharmacy a small amount about the size of a P Zachary Nixon fingers until it is soft and pliable and place within the cavity this can be replaced as often as needed to keep her from getting to the nerve endings. He can safely take Tylenol or ibuprofen for discomfort Given a list of dentists that can see you please call and make an appointment on Monday

## 2016-08-22 NOTE — ED Notes (Signed)
Called Pt to bring back to the RM- No Answer times 3.

## 2016-08-22 NOTE — ED Notes (Signed)
Patient ambulatory to room independently.  No distress noted

## 2016-08-22 NOTE — ED Triage Notes (Addendum)
Pain in tooth on left lower jaw.  Started a few days ago.  Hx of the same.  Also would like neck and right shoulder to be checked out.  Reports that he thinks the pain is from carrying his heavy back pack all the time.

## 2016-08-23 NOTE — ED Notes (Signed)
Patient given discharge instructions, patient not happy about pain management.  NP and this RN told patient he needed to follow up with a dentist.  Patient A&Ox4 and belongings taken with the patient.

## 2016-12-24 IMAGING — CR DG LUMBAR SPINE COMPLETE 4+V
5 series · 5 of 5 positions shown · non-contrast
Comparison: None.

CLINICAL DATA: Fall.  Pain .  Initial evaluation .

EXAM:
LUMBAR SPINE - COMPLETE 4+ VIEW

[l-spine ap]
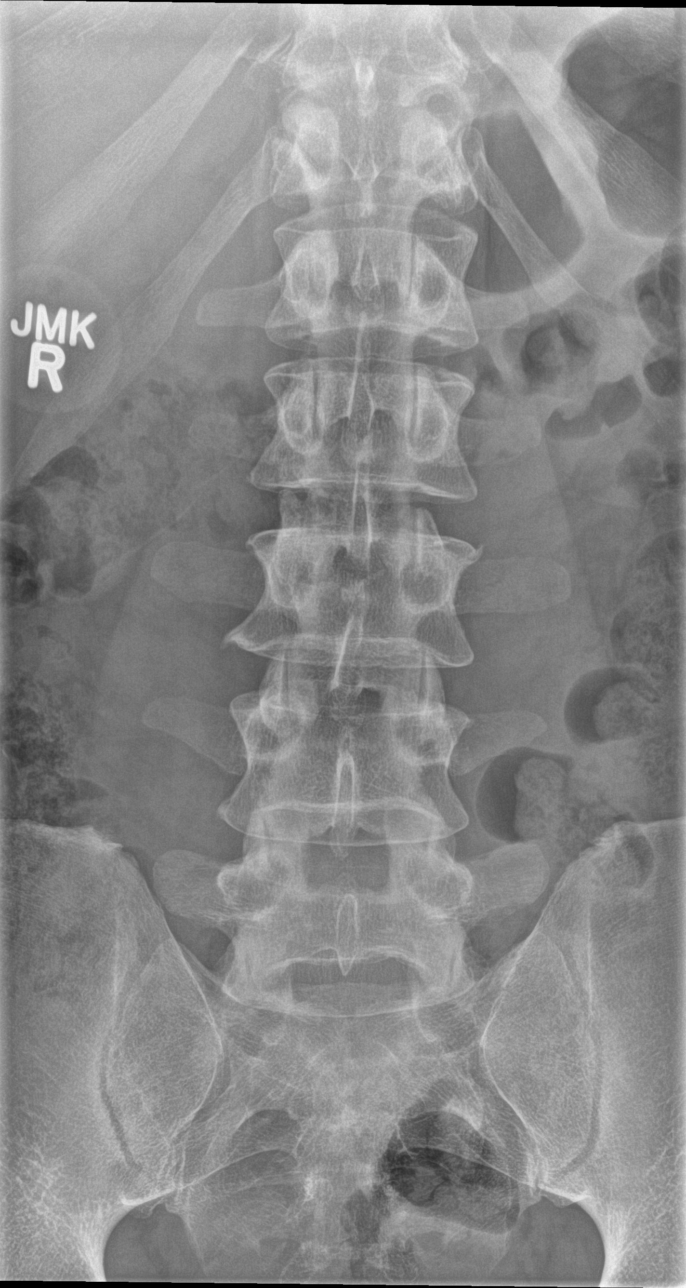

[l-spine obl (1 of 2)]
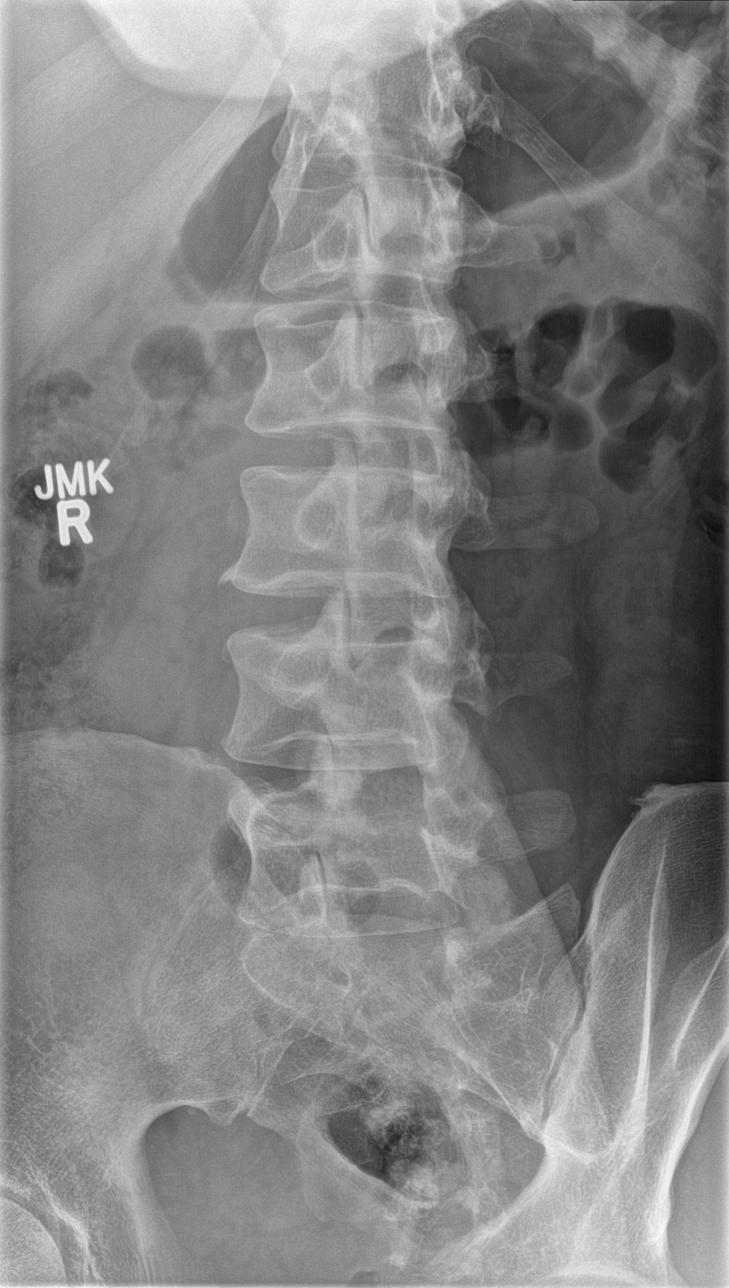

[l-spine obl (2 of 2)]
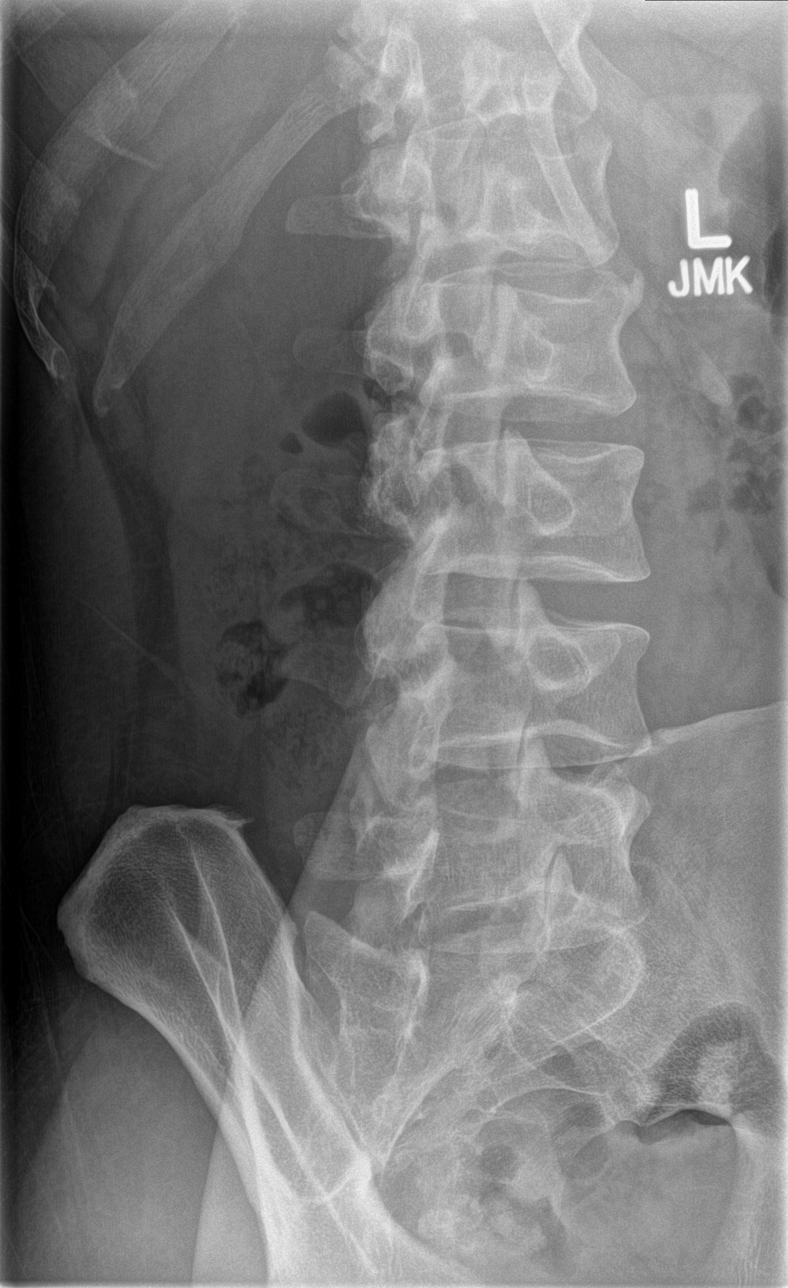

[l-spine lat]
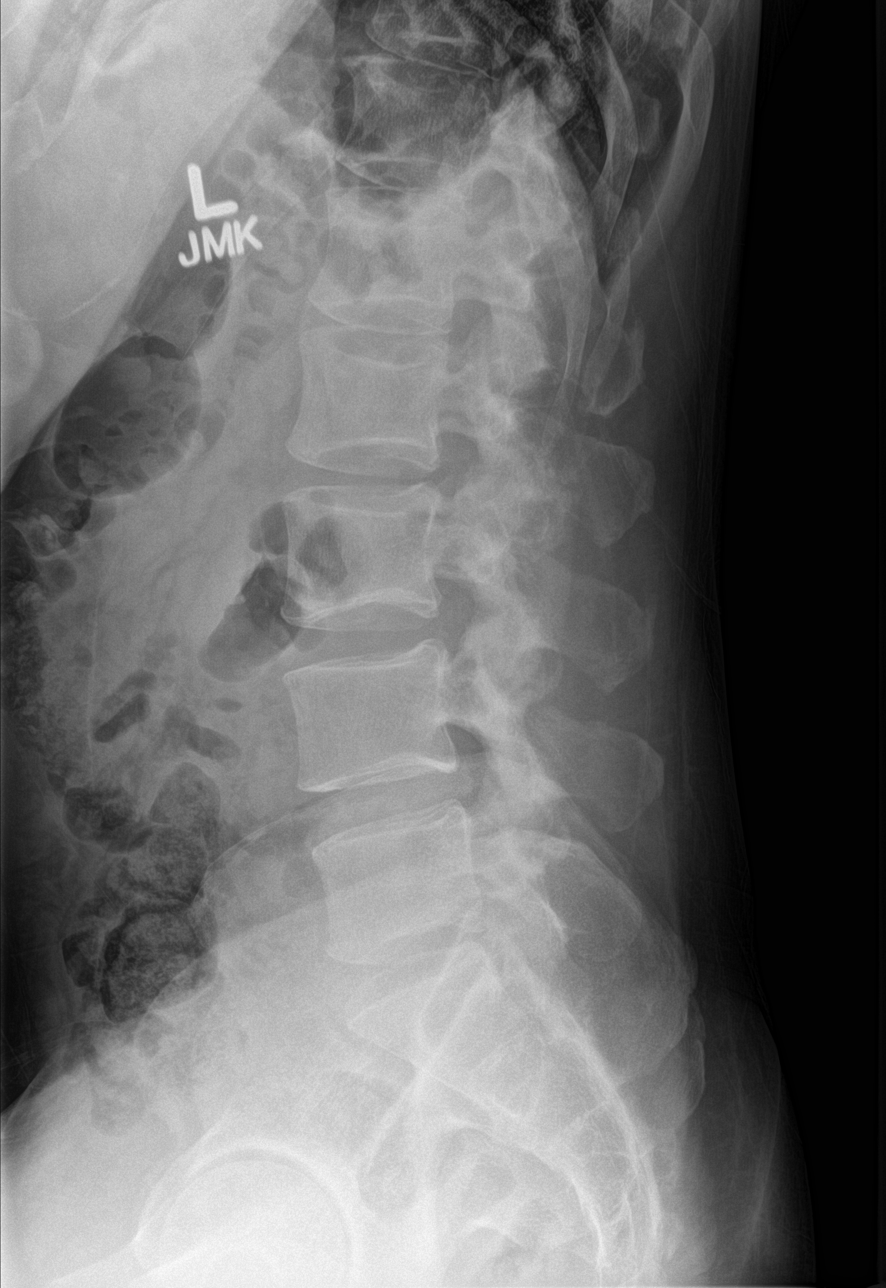

[l-spine spot]
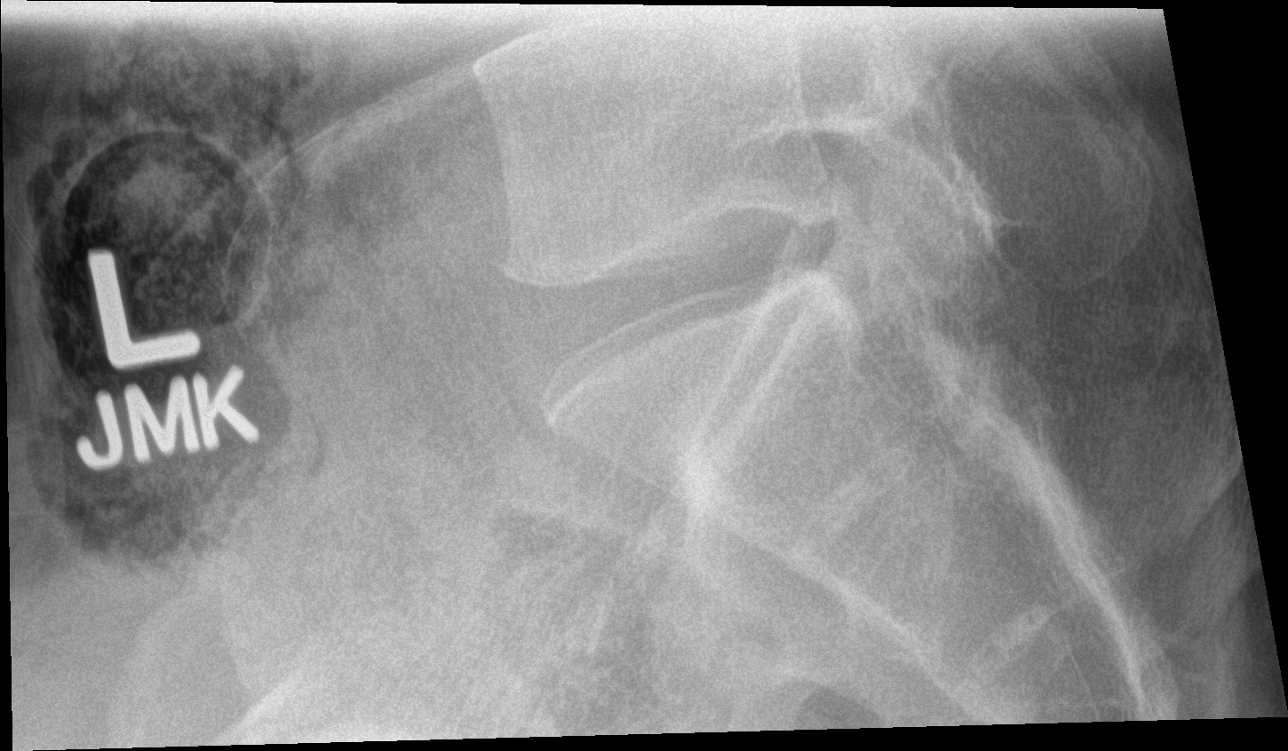

[5 of 5 positions shown; findings below may reference images not displayed]

FINDINGS: Diffuse mild multilevel degenerative change. No acute abnormality
identified. Minimal retrolisthesis of L5 on S1 of approximately 4 mm
noted. This is most likely degenerative.
IMPRESSION: No acute abnormality.  Diffuse mild degenerative change.
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
## 2019-09-07 ENCOUNTER — Ambulatory Visit: Payer: Self-pay | Attending: Family

## 2019-09-07 DIAGNOSIS — Z23 Encounter for immunization: Secondary | ICD-10-CM

## 2019-09-07 NOTE — Progress Notes (Signed)
   Covid-19 Vaccination Clinic  Name:  Zachary Nixon    MRN: [id number] DOB: 1971-01-27  09/07/2019  Mr. Zachary Nixon was observed post Covid-19 immunization for 15 minutes without incident. He was provided with Vaccine Information Sheet and instruction to access the V-Safe system.   Mr. Zachary Nixon was instructed to call 911 with any severe reactions post vaccine: Marland Kitchen Difficulty breathing  . Swelling of face and throat  . A fast heartbeat  . A bad rash all over body  . Dizziness and weakness   Immunizations Administered    Name Date Dose VIS Date Route   Moderna COVID-19 Vaccine 09/07/2019  1:25 PM 0.5 mL 04/2019 Intramuscular   Manufacturer: Moderna   Lot: [id number]   NDC: 24497-530-05

## 2019-10-16 ENCOUNTER — Other Ambulatory Visit: Payer: Self-pay

## 2019-10-16 ENCOUNTER — Encounter: Payer: Self-pay | Admitting: Nurse Practitioner

## 2019-10-16 ENCOUNTER — Ambulatory Visit: Payer: Self-pay | Attending: Nurse Practitioner | Admitting: Nurse Practitioner

## 2019-10-16 VITALS — Ht 72.0 in | Wt 222.0 lb

## 2019-10-16 DIAGNOSIS — Z7689 Persons encountering health services in other specified circumstances: Secondary | ICD-10-CM

## 2019-10-16 DIAGNOSIS — Z13220 Encounter for screening for lipoid disorders: Secondary | ICD-10-CM

## 2019-10-16 DIAGNOSIS — Z130 Encounter for screening for diseases of the blood and blood-forming organs and certain disorders involving the immune mechanism: Secondary | ICD-10-CM

## 2019-10-16 DIAGNOSIS — Z13228 Encounter for screening for other metabolic disorders: Secondary | ICD-10-CM

## 2019-10-16 DIAGNOSIS — Z125 Encounter for screening for malignant neoplasm of prostate: Secondary | ICD-10-CM

## 2019-10-16 DIAGNOSIS — M545 Low back pain: Secondary | ICD-10-CM

## 2019-10-16 DIAGNOSIS — M542 Cervicalgia: Secondary | ICD-10-CM

## 2019-10-16 DIAGNOSIS — G8929 Other chronic pain: Secondary | ICD-10-CM

## 2019-10-16 DIAGNOSIS — K219 Gastro-esophageal reflux disease without esophagitis: Secondary | ICD-10-CM

## 2019-10-16 DIAGNOSIS — Z131 Encounter for screening for diabetes mellitus: Secondary | ICD-10-CM

## 2019-10-16 MED ORDER — MELOXICAM 15 MG PO TABS: 15.0000 mg | ORAL_TABLET | Freq: Every day | ORAL | 1 refills | Status: DC

## 2019-10-16 MED ORDER — OMEPRAZOLE 20 MG PO CPDR: 20.0000 mg | DELAYED_RELEASE_CAPSULE | Freq: Every day | ORAL | 1 refills | Status: AC

## 2019-10-16 MED ORDER — METHOCARBAMOL 500 MG PO TABS: 500.0000 mg | ORAL_TABLET | Freq: Three times a day (TID) | ORAL | 1 refills | Status: DC | PRN
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
## 2019-10-17 ENCOUNTER — Other Ambulatory Visit: Payer: Self-pay | Admitting: Nurse Practitioner

## 2019-10-18 ENCOUNTER — Other Ambulatory Visit: Payer: Self-pay

## 2019-10-18 ENCOUNTER — Ambulatory Visit: Payer: Self-pay | Attending: Nurse Practitioner

## 2019-10-18 DIAGNOSIS — Z125 Encounter for screening for malignant neoplasm of prostate: Secondary | ICD-10-CM

## 2019-10-18 DIAGNOSIS — Z130 Encounter for screening for diseases of the blood and blood-forming organs and certain disorders involving the immune mechanism: Secondary | ICD-10-CM

## 2019-10-18 DIAGNOSIS — Z13228 Encounter for screening for other metabolic disorders: Secondary | ICD-10-CM

## 2019-10-18 DIAGNOSIS — Z131 Encounter for screening for diabetes mellitus: Secondary | ICD-10-CM

## 2019-10-18 DIAGNOSIS — Z13220 Encounter for screening for lipoid disorders: Secondary | ICD-10-CM

## 2019-10-19 LAB — CMP14+EGFR
ALT: 14 IU/L (ref 0–44)
AST: 24 IU/L (ref 0–40)
Albumin/Globulin Ratio: 1.6 (ref 1.2–2.2)
Albumin: 4.3 g/dL (ref 4.0–5.0)
Alkaline Phosphatase: 81 IU/L (ref 48–121)
BUN/Creatinine Ratio: 13 (ref 9–20)
BUN: 14 mg/dL (ref 6–24)
Bilirubin Total: <0.2 mg/dL (ref 0.0–1.2)
CO2: 22 mmol/L (ref 20–29)
Calcium: 9.5 mg/dL (ref 8.7–10.2)
Chloride: 103 mmol/L (ref 96–106)
Creatinine, Ser: 1.04 mg/dL (ref 0.76–1.27)
GFR calc Af Amer: 97 mL/min/{1.73_m2} (ref >59)
GFR calc non Af Amer: 84 mL/min/{1.73_m2} (ref >59)
Globulin, Total: 2.7 g/dL (ref 1.5–4.5)
Glucose: 87 mg/dL (ref 65–99)
Potassium: 4.3 mmol/L (ref 3.5–5.2)
Sodium: 141 mmol/L (ref 134–144)
Total Protein: 7 g/dL (ref 6.0–8.5)

## 2019-10-19 LAB — CBC
Hematocrit: 44.4 % (ref 37.5–51.0)
Hemoglobin: 14.7 g/dL (ref 13.0–17.7)
MCH: 30.9 pg (ref 26.6–33.0)
MCHC: 33.1 g/dL (ref 31.5–35.7)
MCV: 93 fL (ref 79–97)
Platelets: 269 10*3/uL (ref 150–450)
RBC: 4.76 x10E6/uL (ref 4.14–5.80)
RDW: 13.1 % (ref 11.6–15.4)
WBC: 5.1 10*3/uL (ref 3.4–10.8)

## 2019-10-19 LAB — LIPID PANEL
Chol/HDL Ratio: 3.6 ratio (ref 0.0–5.0)
Cholesterol, Total: 174 mg/dL (ref 100–199)
HDL: 48 mg/dL (ref >39)
LDL Chol Calc (NIH): 114 mg/dL — ABNORMAL HIGH (ref 0–99)
Triglycerides: 65 mg/dL (ref 0–149)
VLDL Cholesterol Cal: 12 mg/dL (ref 5–40)

## 2019-10-19 LAB — HEMOGLOBIN A1C
Est. average glucose Bld gHb Est-mCnc: 108 mg/dL
Hgb A1c MFr Bld: 5.4 % (ref 4.8–5.6)

## 2019-10-19 LAB — PSA: Prostate Specific Ag, Serum: 3.3 ng/mL (ref 0.0–4.0)

## 2021-04-01 ENCOUNTER — Ambulatory Visit: Payer: Self-pay | Admitting: *Deleted

## 2021-04-01 NOTE — Telephone Encounter (Signed)
Patient is calling to report he is having trouble passing urine- a lot of pressure and slow stream. Patient states he also has back pain and dark urine. Call to office- no open appointment- advised UC for evaluation.

## 2021-04-01 NOTE — Telephone Encounter (Signed)
Reason for Disposition  Side (flank) or lower back pain present  Answer Assessment - Initial Assessment Questions 1. SYMPTOM: "What's the main symptom you're concerned about?" (e.g., frequency, incontinence)     Hard to urinate- hard to pass urine 2. ONSET: "When did the  pressure  start?"     2 weeks 3. PAIN: "Is there any pain?" If Yes, ask: "How bad is it?" (Scale: 1-10; mild, moderate, severe)     No- pressure 4. CAUSE: "What do you think is causing the symptoms?"     UTI possible 5. OTHER SYMPTOMS: "Do you have any other symptoms?" (e.g., fever, flank pain, blood in urine, pain with urination)     Flank pain- lower back, dark urine 6. PREGNANCY: "Is there any chance you are pregnant?" "When was your last menstrual period?"     na  Protocols used: Urinary Symptoms-A-AH

## 2021-04-07 NOTE — Telephone Encounter (Signed)
Left message on voicemail to return call.

## 2021-04-09 ENCOUNTER — Other Ambulatory Visit (HOSPITAL_COMMUNITY)
Admission: RE | Admit: 2021-04-09 | Discharge: 2021-04-09 | Disposition: A | Discharge status: Home / Self Care | Payer: Self-pay | Source: Ambulatory Visit | Attending: Nurse Practitioner | Admitting: Nurse Practitioner

## 2021-04-09 ENCOUNTER — Other Ambulatory Visit: Payer: Self-pay

## 2021-04-09 ENCOUNTER — Encounter: Payer: Self-pay | Admitting: Nurse Practitioner

## 2021-04-09 ENCOUNTER — Ambulatory Visit: Payer: Self-pay | Attending: Nurse Practitioner | Admitting: Nurse Practitioner

## 2021-04-09 VITALS — BP 127/78 | HR 74 | Ht 72.0 in | Wt 219.4 lb

## 2021-04-09 DIAGNOSIS — Z114 Encounter for screening for human immunodeficiency virus [HIV]: Secondary | ICD-10-CM

## 2021-04-09 DIAGNOSIS — N399 Disorder of urinary system, unspecified: Secondary | ICD-10-CM | POA: Insufficient documentation

## 2021-04-09 DIAGNOSIS — Z7251 High risk heterosexual behavior: Secondary | ICD-10-CM

## 2021-04-09 DIAGNOSIS — M545 Low back pain, unspecified: Secondary | ICD-10-CM

## 2021-04-09 DIAGNOSIS — Z1159 Encounter for screening for other viral diseases: Secondary | ICD-10-CM

## 2021-04-09 DIAGNOSIS — Z1211 Encounter for screening for malignant neoplasm of colon: Secondary | ICD-10-CM

## 2021-04-09 DIAGNOSIS — G8929 Other chronic pain: Secondary | ICD-10-CM

## 2021-04-09 DIAGNOSIS — R3911 Hesitancy of micturition: Secondary | ICD-10-CM

## 2021-04-09 LAB — POCT URINALYSIS DIP (CLINITEK)
Bilirubin, UA: NEGATIVE
Glucose, UA: NEGATIVE mg/dL
Ketones, POC UA: NEGATIVE mg/dL
Leukocytes, UA: NEGATIVE
Nitrite, UA: NEGATIVE
POC PROTEIN,UA: NEGATIVE
Spec Grav, UA: 1.025 (ref 1.010–1.025)
Urobilinogen, UA: 1 E.U./dL
pH, UA: 6 (ref 5.0–8.0)

## 2021-04-09 LAB — POCT GLYCOSYLATED HEMOGLOBIN (HGB A1C): Hemoglobin A1C: 5.8 % — AB (ref 4.0–5.6)

## 2021-04-09 MED ORDER — MELOXICAM 15 MG PO TABS
15.0000 mg | ORAL_TABLET | Freq: Every day | ORAL | 1 refills | Status: AC
  Filled 2021-04-09 – 2021-04-21 (×2): qty 30, 30d supply, fill #0

## 2021-04-09 MED ORDER — METHOCARBAMOL 500 MG PO TABS
500.0000 mg | ORAL_TABLET | Freq: Three times a day (TID) | ORAL | 1 refills | Status: AC | PRN
  Filled 2021-04-09 – 2021-04-21 (×2): qty 60, 20d supply, fill #0

## 2021-04-09 MED ORDER — CEFTRIAXONE SODIUM 500 MG IJ SOLR
500.0000 mg | Freq: Once | INTRAMUSCULAR | Status: AC
Start: 2021-04-09 — End: 2021-04-09
  Administered 2021-04-09: 500 mg via INTRAMUSCULAR

## 2021-04-09 NOTE — Progress Notes (Signed)
Assessment & Plan:  Harsha was seen today for urinary frequency.  Diagnoses and all orders for this visit:  Urinary problem in male -     Cancel: Hemoglobin A1c -     POCT URINALYSIS DIP (CLINITEK) -     Urine cytology ancillary only -     HgB A1c  Chronic bilateral low back pain without sciatica -     meloxicam (MOBIC) 15 MG tablet; Take 1 tablet (15 mg total) by mouth daily. -     methocarbamol (ROBAXIN) 500 MG tablet; Take 1 tablet (500 mg total) by mouth every 8 (eight) hours as needed for muscle spasms.  Hesitancy of micturition -     PSA  High risk sexual behavior, unspecified type -     POCT URINALYSIS DIP (CLINITEK) -     Urine cytology ancillary only -     HIV antibody (with reflex) -     cefTRIAXone (ROCEPHIN) injection 500 mg  Encounter for screening for HIV -     HIV antibody (with reflex)  Colon cancer screening -     Fecal occult blood, imunochemical([facility])  Need for hepatitis C screening test -     HCV Ab w Reflex to Quant PCR   Patient has been counseled on age-appropriate routine health concerns for screening and prevention. These are reviewed and up-to-date. Referrals have been placed accordingly. Immunizations are up-to-date or declined.    Subjective:   Chief Complaint  Patient presents with   Urinary Frequency   HPI Zachary Nixon 50 y.o. male presents to office today for follow up to GU concern. He has no past medical history on file.   GU SYMPTOMS He endorses the following symptoms 2 weeks [id number]:Urinary hesitancy Midline back pain (not new), straining, hematuria and penile discharge which has currently resolved. He denies nocturia. He does engage in sex with multiple partners.   Chronic Back Pain Midline without sciatica. He has been helping take care of mother who is ill: lifting, cleaning and cooking.   Review of Systems  Constitutional:  Negative for fever, malaise/fatigue and weight loss.  HENT: Negative.  Negative  for nosebleeds.   Eyes: Negative.  Negative for blurred vision, double vision and photophobia.  Respiratory: Negative.  Negative for cough and shortness of breath.   Cardiovascular: Negative.  Negative for chest pain, palpitations and leg swelling.  Gastrointestinal: Negative.  Negative for heartburn, nausea and vomiting.  Genitourinary:  Positive for frequency, hematuria and urgency. Negative for dysuria and flank pain.       SEE HPI  Musculoskeletal:  Positive for back pain and myalgias.  Neurological: Negative.  Negative for dizziness, focal weakness, seizures and headaches.  Psychiatric/Behavioral: Negative.  Negative for suicidal ideas.    History reviewed. No pertinent past medical history.  Past Surgical History:  Procedure Laterality Date   NO PAST SURGERIES      Family History  Problem Relation Age of Onset   Hypertension Mother    Schizophrenia Mother    Cancer Father    Prostate cancer Father     Social History Reviewed with no changes to be made today.   Outpatient Medications Prior to Visit  Medication Sig Dispense Refill   omeprazole (PRILOSEC) 20 MG capsule Take 1 capsule (20 mg total) by mouth daily. 90 capsule 1   meloxicam (MOBIC) 15 MG tablet Take 1 tablet (15 mg total) by mouth daily. (Patient not taking: Reported on 04/09/2021) 30 tablet 1   methocarbamol (ROBAXIN) 500  MG tablet Take 1 tablet (500 mg total) by mouth every 8 (eight) hours as needed for muscle spasms. (Patient not taking: Reported on 04/09/2021) 60 tablet 1   No facility-administered medications prior to visit.    No Known Allergies     Objective:    BP 127/78   Pulse 74   Ht 6' (1.829 m)   Wt 219 lb 6 oz (99.5 kg)   SpO2 96%   BMI 29.75 kg/m  Wt Readings from Last 3 Encounters:  04/09/21 219 lb 6 oz (99.5 kg)  10/16/19 222 lb (100.7 kg)  08/22/16 193 lb 9 oz (87.8 kg)    Physical Exam Vitals and nursing note reviewed.  Constitutional:      Appearance: He is well-developed.   HENT:     Head: Normocephalic and atraumatic.  Cardiovascular:     Rate and Rhythm: Normal rate and regular rhythm.     Heart sounds: Normal heart sounds. No murmur heard.   No friction rub. No gallop.  Pulmonary:     Effort: Pulmonary effort is normal. No tachypnea or respiratory distress.     Breath sounds: Normal breath sounds. No decreased breath sounds, wheezing, rhonchi or rales.  Chest:     Chest wall: No tenderness.  Abdominal:     General: Bowel sounds are normal.     Palpations: Abdomen is soft.  Musculoskeletal:        General: Normal range of motion.     Cervical back: Normal range of motion.  Skin:    General: Skin is warm and dry.  Neurological:     Mental Status: He is alert and oriented to person, place, and time.     Coordination: Coordination normal.  Psychiatric:        Behavior: Behavior normal. Behavior is cooperative.        Thought Content: Thought content normal.        Judgment: Judgment normal.         Patient has been counseled extensively about nutrition and exercise as well as the importance of adherence with medications and regular follow-up. The patient was given clear instructions to go to ER or return to medical center if symptoms don't improve, worsen or new problems develop. The patient verbalized understanding.   Follow-up: Return if symptoms worsen or fail to improve.   Claiborne Rigg, FNP-BC [facility] [place], [place] [phone]   04/09/2021, 4:38 PM

## 2021-04-10 ENCOUNTER — Other Ambulatory Visit: Payer: Self-pay | Admitting: Nurse Practitioner

## 2021-04-10 DIAGNOSIS — R972 Elevated prostate specific antigen [PSA]: Secondary | ICD-10-CM

## 2021-04-10 LAB — URINE CYTOLOGY ANCILLARY ONLY
Bacterial Vaginitis-Urine: NEGATIVE
Candida Urine: NEGATIVE
Chlamydia: POSITIVE — AB
Comment: NEGATIVE
Comment: NEGATIVE
Comment: NORMAL
Neisseria Gonorrhea: NEGATIVE
Trichomonas: NEGATIVE

## 2021-04-10 LAB — HCV AB W REFLEX TO QUANT PCR: HCV Ab: 0.3 s/co ratio (ref 0.0–0.9)

## 2021-04-10 LAB — PSA: Prostate Specific Ag, Serum: 11.6 ng/mL — ABNORMAL HIGH (ref 0.0–4.0)

## 2021-04-10 LAB — HIV ANTIBODY (ROUTINE TESTING W REFLEX): HIV Screen 4th Generation wRfx: NONREACTIVE

## 2021-04-10 LAB — HCV INTERPRETATION

## 2021-04-10 MED ORDER — DOXYCYCLINE HYCLATE 100 MG PO TABS
100.0000 mg | ORAL_TABLET | Freq: Two times a day (BID) | ORAL | 0 refills | Status: AC
  Filled 2021-04-10 – 2021-04-21 (×2): qty 14, 7d supply, fill #0

## 2021-04-11 ENCOUNTER — Other Ambulatory Visit: Payer: Self-pay

## 2021-04-11 ENCOUNTER — Telehealth: Payer: Self-pay

## 2021-04-11 NOTE — Telephone Encounter (Signed)
-----   Message from Claiborne Rigg, NP sent at 04/10/2021  8:00 PM EST ----- PSA is very elevated. HIV negative. Positive for chlamydia. Will send medication to the pharmacy. Will need to repeat prostate level in 4 weeks. Needs lab appt . Could be elevated due to chlamydia infection  A1c shows prediabetes.

## 2021-04-11 NOTE — Telephone Encounter (Signed)
Called patient reviewed all information and repeated back to me. Will call if any questions. Made a pt appt for 4 weeks.

## 2021-04-15 ENCOUNTER — Other Ambulatory Visit: Payer: Self-pay

## 2021-04-16 ENCOUNTER — Other Ambulatory Visit: Payer: Self-pay

## 2021-04-18 ENCOUNTER — Other Ambulatory Visit: Payer: Self-pay

## 2021-04-21 ENCOUNTER — Other Ambulatory Visit: Payer: Self-pay

## 2021-04-27 LAB — FECAL OCCULT BLOOD, IMMUNOCHEMICAL: Fecal Occult Bld: NEGATIVE

## 2021-05-13 ENCOUNTER — Other Ambulatory Visit: Payer: Self-pay

## 2022-02-05 ENCOUNTER — Other Ambulatory Visit: Payer: Self-pay

## 2022-12-16 ENCOUNTER — Encounter: Payer: Self-pay | Admitting: Nurse Practitioner
